# Patient Record
Sex: Male | Born: 1935 | Race: White | Hispanic: No | Marital: Married
Health system: Southern US, Community
[De-identification: ages and names within clinical notes are randomized; demographics above are authoritative.]

---

## 2003-09-26 ENCOUNTER — Inpatient Hospital Stay (HOSPITAL_COMMUNITY): Admission: RE | Admit: 2003-09-26 | Discharge: 2003-09-27 | Payer: Self-pay | Admitting: Cardiology

## 2004-12-23 ENCOUNTER — Emergency Department: Payer: Self-pay | Admitting: Emergency Medicine

## 2004-12-23 ENCOUNTER — Other Ambulatory Visit: Payer: Self-pay

## 2006-07-03 ENCOUNTER — Other Ambulatory Visit: Payer: Self-pay

## 2006-07-03 ENCOUNTER — Inpatient Hospital Stay: Payer: Self-pay | Admitting: Endocrinology

## 2006-07-05 ENCOUNTER — Other Ambulatory Visit: Payer: Self-pay

## 2006-07-06 ENCOUNTER — Other Ambulatory Visit: Payer: Self-pay

## 2006-07-07 ENCOUNTER — Other Ambulatory Visit: Payer: Self-pay

## 2006-07-09 ENCOUNTER — Other Ambulatory Visit: Payer: Self-pay

## 2007-07-25 ENCOUNTER — Ambulatory Visit: Payer: Self-pay | Admitting: Internal Medicine

## 2007-08-24 ENCOUNTER — Ambulatory Visit: Payer: Self-pay | Admitting: Gastroenterology

## 2007-09-20 ENCOUNTER — Ambulatory Visit: Payer: Self-pay | Admitting: Gastroenterology

## 2008-05-01 ENCOUNTER — Inpatient Hospital Stay: Payer: Self-pay | Admitting: Internal Medicine

## 2009-03-13 ENCOUNTER — Inpatient Hospital Stay: Payer: Self-pay | Admitting: Internal Medicine

## 2009-03-17 ENCOUNTER — Ambulatory Visit: Payer: Self-pay | Admitting: Internal Medicine

## 2009-05-01 ENCOUNTER — Ambulatory Visit: Payer: Self-pay | Admitting: Internal Medicine

## 2009-06-17 ENCOUNTER — Ambulatory Visit: Payer: Self-pay | Admitting: Gastroenterology

## 2010-09-15 ENCOUNTER — Ambulatory Visit: Payer: Self-pay | Admitting: Internal Medicine

## 2010-09-24 ENCOUNTER — Inpatient Hospital Stay: Payer: Self-pay | Admitting: Internal Medicine

## 2010-10-02 ENCOUNTER — Inpatient Hospital Stay: Payer: Self-pay | Admitting: Internal Medicine

## 2010-10-16 ENCOUNTER — Ambulatory Visit: Payer: Self-pay | Admitting: Internal Medicine

## 2011-01-06 ENCOUNTER — Ambulatory Visit: Payer: Self-pay | Admitting: Family Medicine

## 2011-01-07 ENCOUNTER — Ambulatory Visit: Payer: Self-pay | Admitting: Family Medicine

## 2011-02-28 ENCOUNTER — Ambulatory Visit: Payer: Self-pay | Admitting: Vascular Surgery

## 2011-02-28 LAB — BASIC METABOLIC PANEL
Anion Gap: 11 (ref 7–16)
BUN: 55 mg/dL — ABNORMAL HIGH (ref 7–18)
Calcium, Total: 8.5 mg/dL (ref 8.5–10.1)
Chloride: 112 mmol/L — ABNORMAL HIGH (ref 98–107)
Osmolality: 310 (ref 275–301)
Potassium: 3.5 mmol/L (ref 3.5–5.1)

## 2011-02-28 LAB — PROTIME-INR
INR: 1.1
Prothrombin Time: 14.7 secs (ref 11.5–14.7)

## 2011-02-28 LAB — CREATININE, SERUM
Creatinine: 1.9 mg/dL — ABNORMAL HIGH (ref 0.60–1.30)
EGFR (African American): 44 — ABNORMAL LOW
EGFR (Non-African Amer.): 36 — ABNORMAL LOW

## 2011-02-28 LAB — BUN: BUN: 57 mg/dL — ABNORMAL HIGH (ref 7–18)

## 2011-03-08 ENCOUNTER — Ambulatory Visit: Payer: Self-pay | Admitting: Family Medicine

## 2011-03-08 LAB — CBC WITH DIFFERENTIAL/PLATELET
Basophil #: 0 10*3/uL (ref 0.0–0.1)
Basophil %: 0.4 %
Eosinophil #: 0.1 10*3/uL (ref 0.0–0.7)
HCT: 23.2 % — ABNORMAL LOW (ref 40.0–52.0)
HGB: 7.6 g/dL — ABNORMAL LOW (ref 13.0–18.0)
Lymphocyte #: 1.2 10*3/uL (ref 1.0–3.6)
Lymphocyte %: 10.4 %
MCH: 30.3 pg (ref 26.0–34.0)
MCHC: 32.9 g/dL (ref 32.0–36.0)
MCV: 92 fL (ref 80–100)
Monocyte #: 0.8 10*3/uL — ABNORMAL HIGH (ref 0.0–0.7)
Neutrophil #: 9.1 10*3/uL — ABNORMAL HIGH (ref 1.4–6.5)
Platelet: 257 10*3/uL (ref 150–440)
RDW: 18.7 % — ABNORMAL HIGH (ref 11.5–14.5)
WBC: 11.2 10*3/uL — ABNORMAL HIGH (ref 3.8–10.6)

## 2011-04-15 DEATH — deceased

## 2012-12-06 IMAGING — CR DG CHEST 2V
1 series · 2 of 2 positions shown · non-contrast
Comparison: none

REASON FOR EXAM: weakness
COMMENTS:

[Series 1: view not recorded · 0.17mm/px · 2 of 2 slices shown]
[im 1/2]
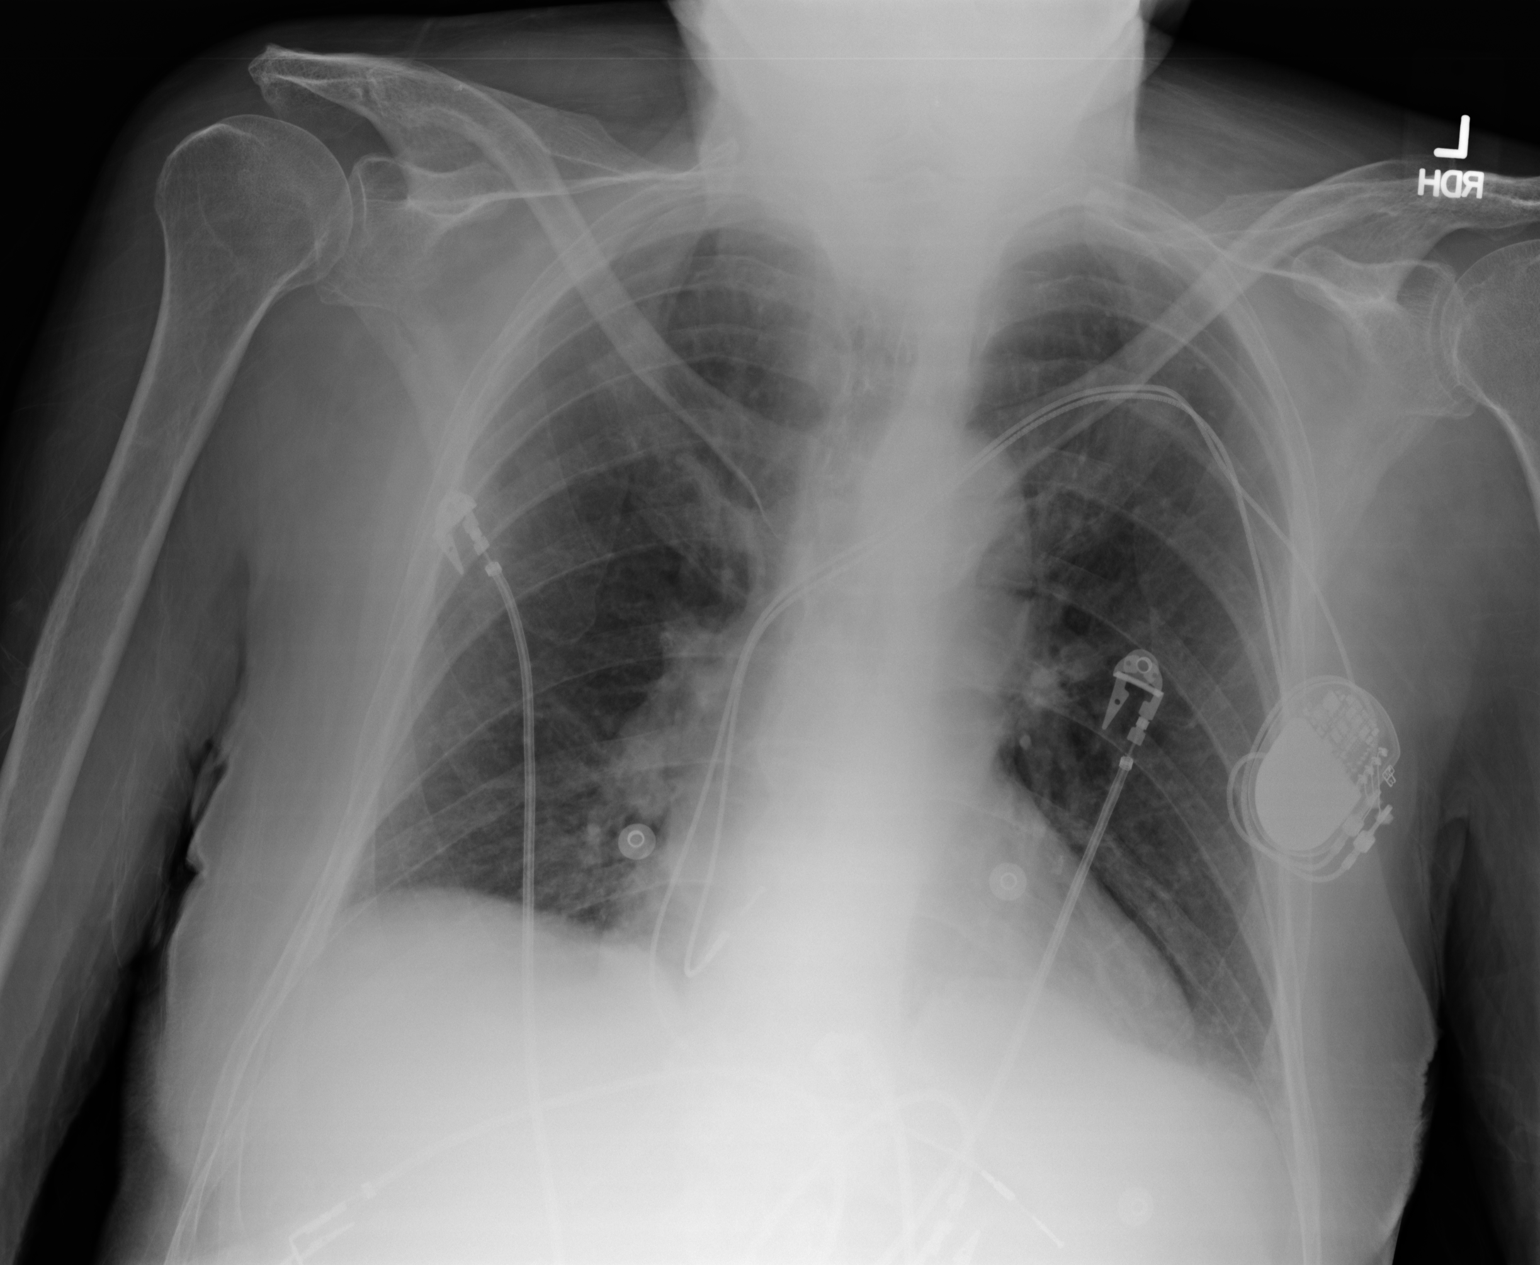
[im 2/2]
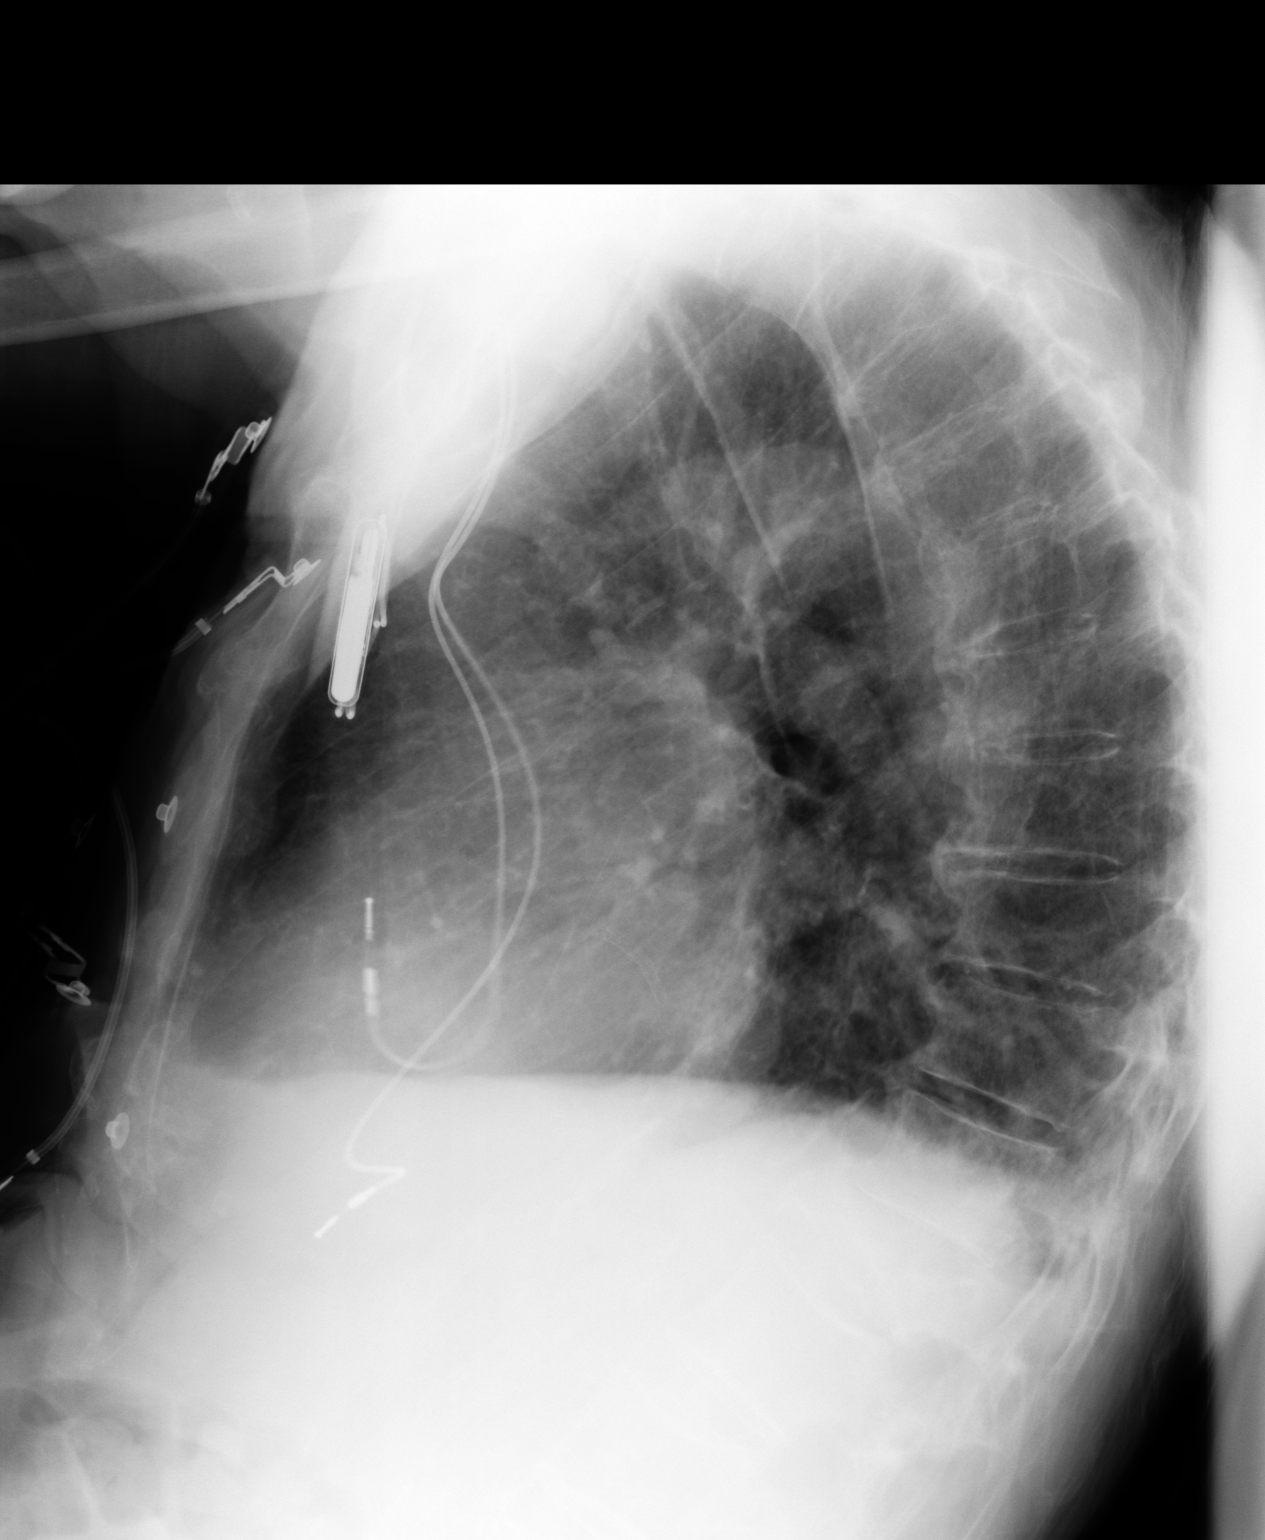

[2 of 2 positions shown; findings below may reference images not displayed]

PROCEDURE:     DXR - DXR CHEST PA (OR AP) AND LATERAL  - October 02, 2010  [DATE]

RESULT:     The lungs are clear. The cardiovascular structures are
unremarkable.  Cardiac pacer noted with lead tips in the right atrium and
right ventricle. Degenerative change is noted of the thoracic spine. Chest
is stable from 09/24/2010.
IMPRESSION: See above.

## 2013-05-04 IMAGING — XA IR VASCULAR PROCEDURE
15 of 23 series · 15 of 24 positions shown · non-contrast
Comparison: none

[Series 1: care aorta · 1 of 2 slices shown]
[im 1/2]
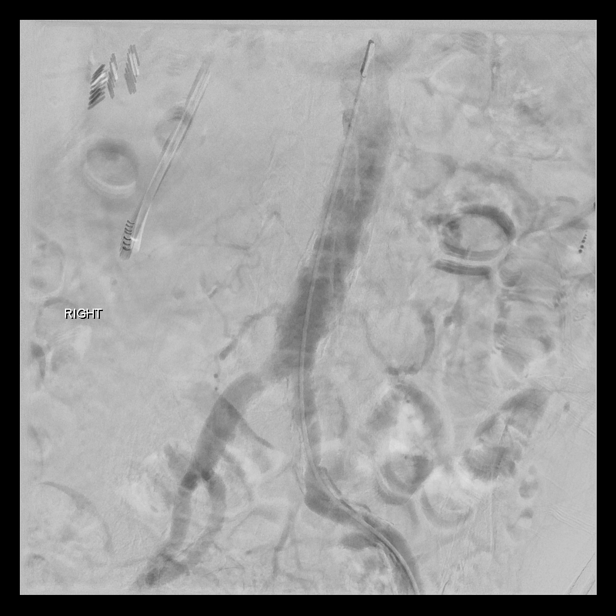

[Series 3: care sfa · 1 of 2 slices shown (1 of 12)]
[im 1/2]
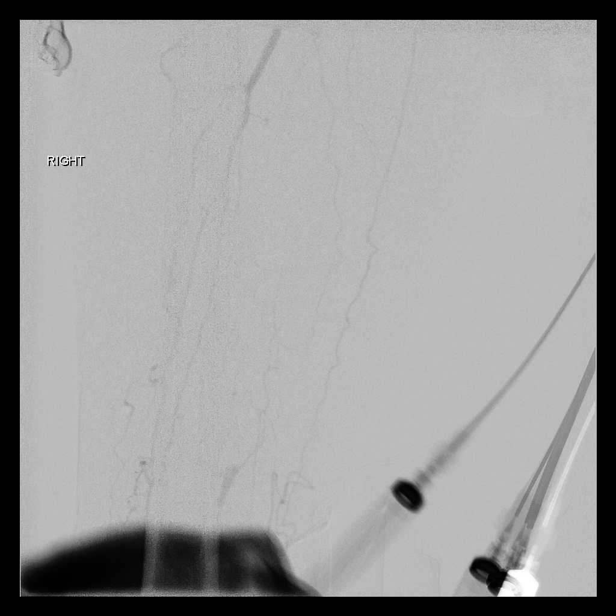

[Series 6: care sfa · 1 of 2 slices shown (2 of 12)]
[im 1/2]
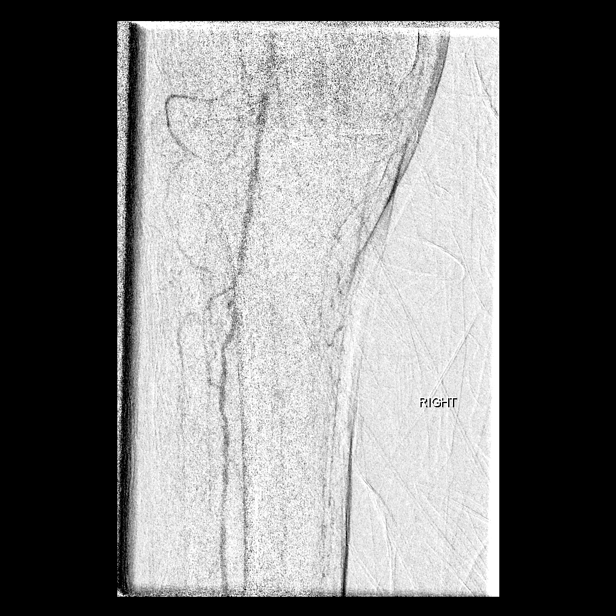

[Series 7: care sfa · 1 of 2 slices shown (3 of 12)]
[im 1/2]
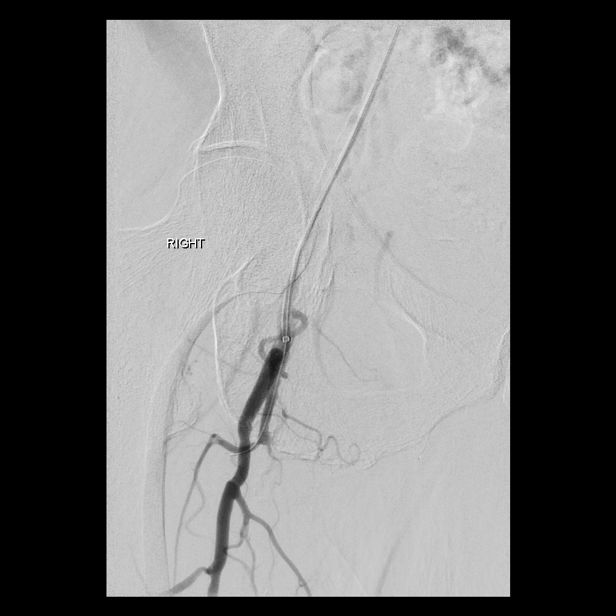

[Series 9: care sfa · 1 of 2 slices shown (4 of 12)]
[im 1/2]
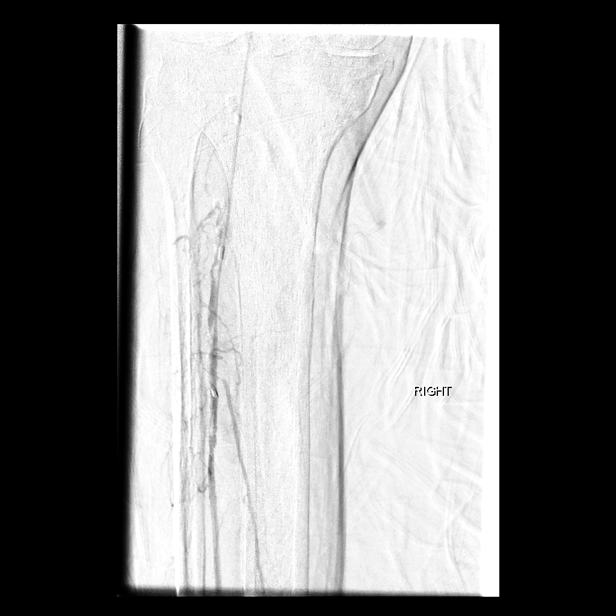

[Series 10: care sfa · 1 of 2 slices shown (5 of 12)]
[im 1/2]
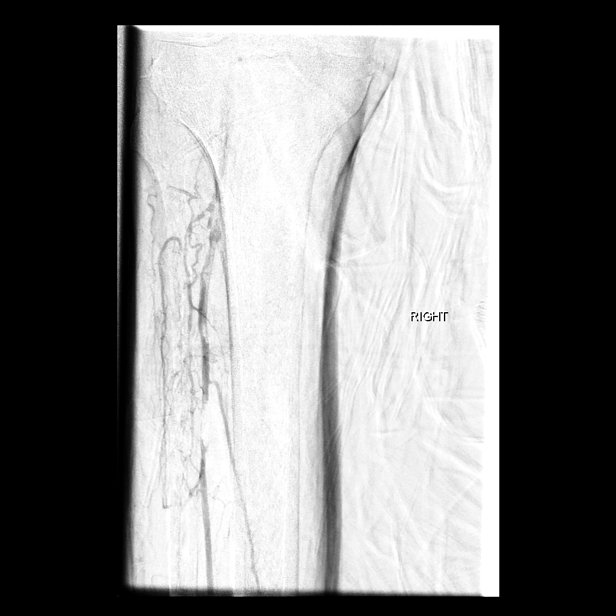

[Series 12: fl - angio · 1 of 1 slices shown (1 of 2)]
[im 1/1]
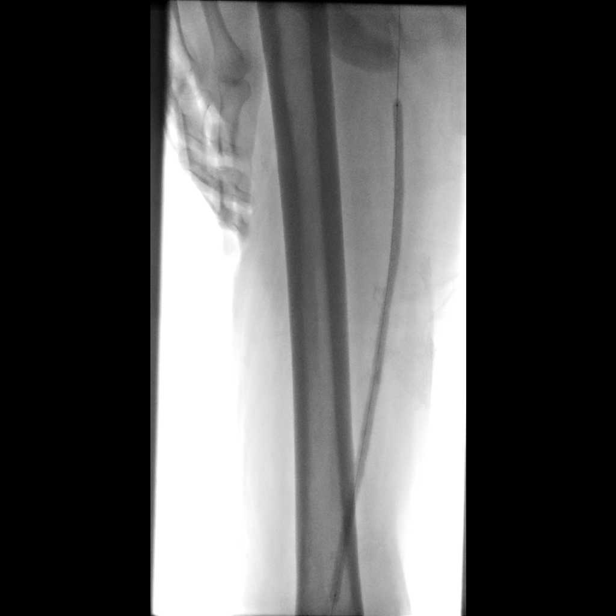

[Series 14: care sfa · 1 of 2 slices shown (6 of 12)]
[im 1/2]
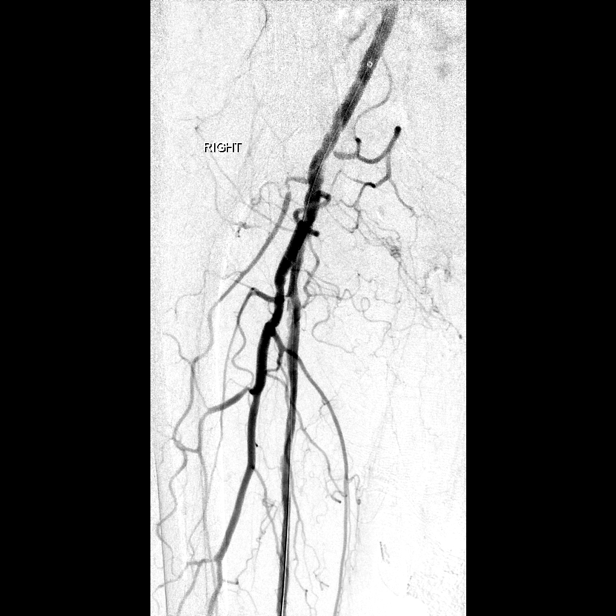

[Series 16: care sfa · 1 of 2 slices shown (7 of 12)]
[im 1/2]
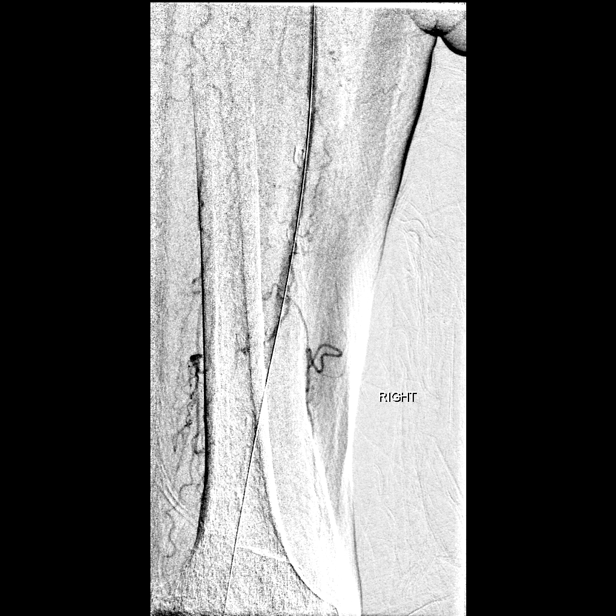

[Series 18: care sfa · 1 of 2 slices shown (8 of 12)]
[im 1/2]
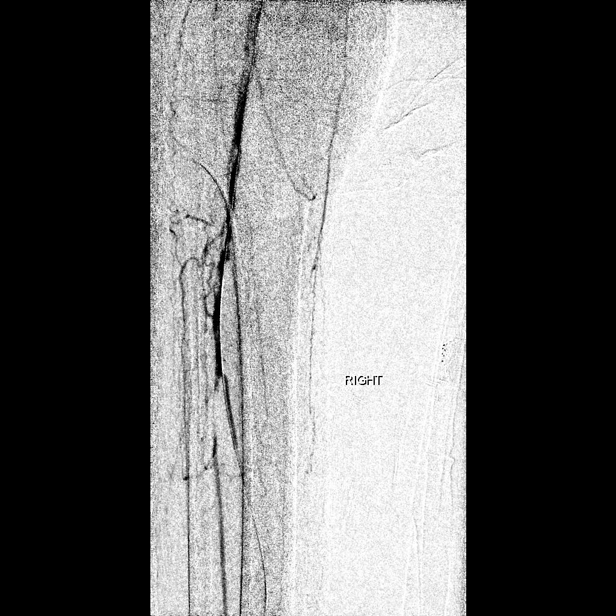

[Series 19: care sfa · 1 of 4 slices shown (9 of 12)]
[im 1/4]
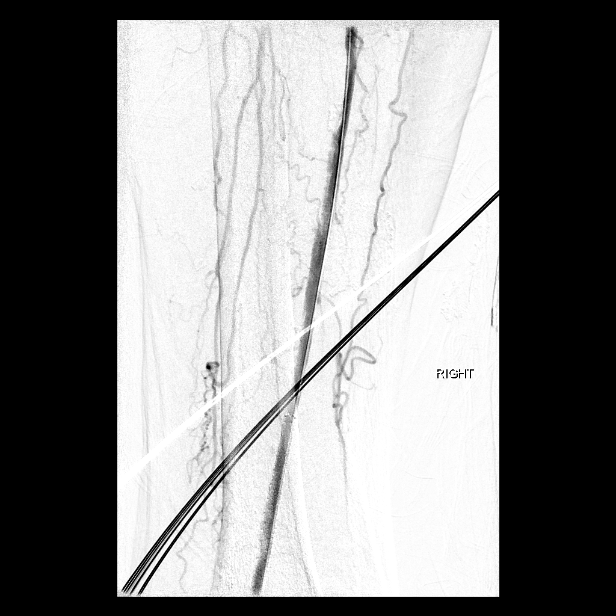

[Series 20: fl - angio · 1 of 1 slices shown (2 of 2)]
[im 1/1]
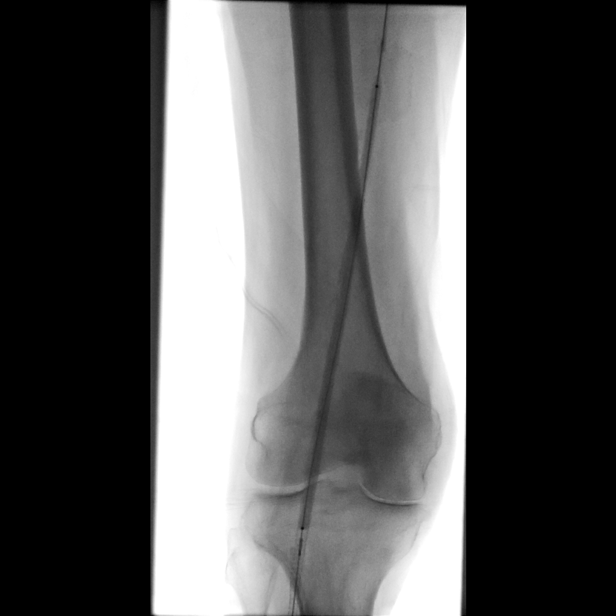

[Series 22: care sfa · 1 of 2 slices shown (10 of 12)]
[im 1/2]
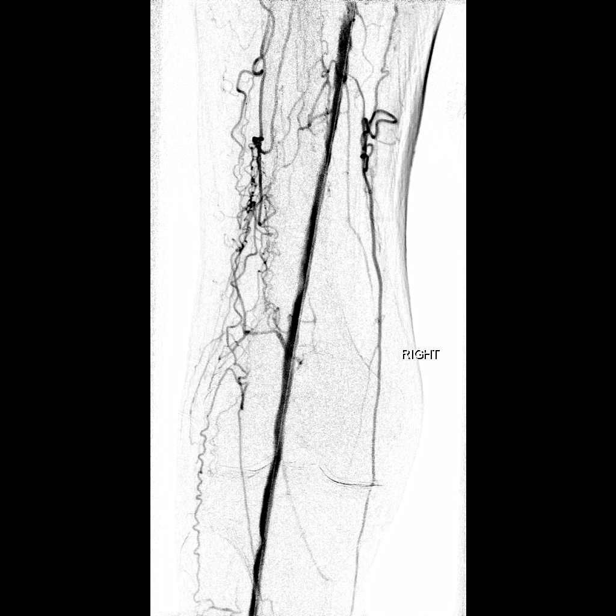

[Series 23: care sfa · 1 of 2 slices shown (11 of 12)]
[im 1/2]
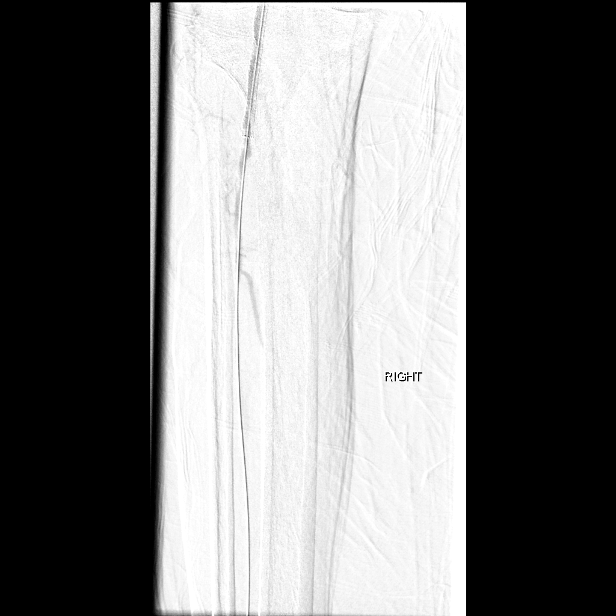

[Series 25: care sfa · 1 of 1 slices shown (12 of 12)]
[im 1/1]
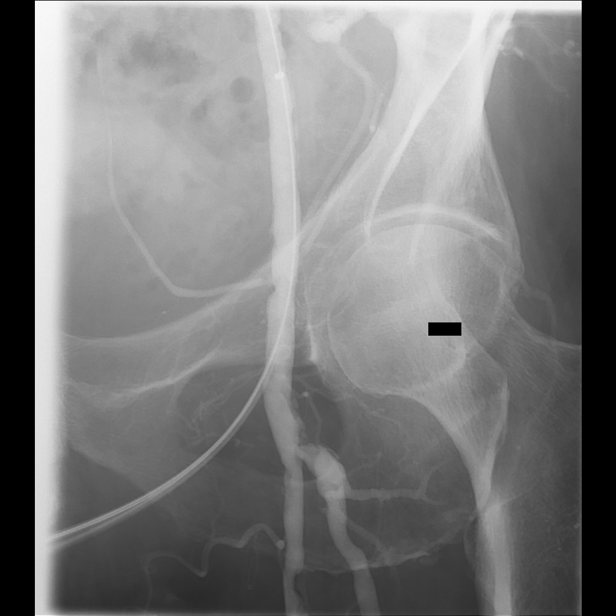

[15 of 24 positions shown; findings below may reference images not displayed]

IMAGES IMPORTED FROM THE [employer] WORKFLOW SYSTEM
NO DICTATION FOR STUDY

## 2014-06-08 NOTE — Op Note (Signed)
PATIENT NAME:  Cameron Golden, FREEMAN W MR#:  [id number] DATE OF BIRTH:  [birth year]  DATE OF PROCEDURE:  02/28/2011  PREOPERATIVE DIAGNOSES:  1. Peripheral arterial disease with multiple nonhealing ulcerations right lower extremity.  2. Advanced dementia.  3. Four nonhealing ulcers right lower extremity.   POSTOPERATIVE DIAGNOSES:  1. Peripheral arterial disease with multiple nonhealing ulcerations right lower extremity.  2. Advanced dementia.  3. Four nonhealing ulcers right lower extremity.   PROCEDURES: 1. Catheter placement into right posterior tibial artery from left femoral approach.  2. Aortogram and selective right lower extremity angiogram.  3. Percutaneous transluminal angioplasty of right posterior tibial artery and tibioperoneal trunk with 3 mm diameter angioplasty balloon.  4. Percutaneous transluminal angioplasty of entire popliteal artery and superficial femoral artery with 5 mm diameter angioplasty balloon.  5. Self-expanding stent placement to popliteal artery and distal superficial femoral artery for greater than 50% stenosis and dissection after angioplasty.  6. StarClose closure device left femoral artery.   SURGEON: Annice NeedyJason S. Armon Orvis, M.D.   ANESTHESIA: Local with moderate conscious sedation.   ESTIMATED BLOOD LOSS: Minimal.   FLUOROSCOPY TIME: 8.9.  CONTRAST USED: 80 mL.   INDICATION FOR PROCEDURE: This is a demented 81 year old individual who has four nonhealing ulcers of the right lower extremity that have been present for some time. These are large with a necrotic eschar. He has no palpable pedal pulses and is clearly ischemic. He is brought down for angiography with possible revascularization for attempt at limb salvage. Risks and benefits were discussed. Informed consent was obtained.   DESCRIPTION OF PROCEDURE: Patient is brought to the vascular interventional radiology suite, groins shaved and prepped, sterile surgical field was created. The left femoral head was  localized wit fluoroscopy and the left femoral artery was accessed without difficulty with a Seldinger needle. A 3-J wire was placed, a 5 French sheath was placed over the wire. Pigtail catheter was placed in the aortic at the L1 level and AP aortogram was performed. This showed no significant flow limiting stenosis in the aortoiliac segments. There was what appeared to be patent renal arteries. I then hooked the aortic bifurcation, advanced to the right femoral head and selective right lower extremity angiogram was then performed. This showed a flush occlusion of the right SFA, a very diseased popliteal reconstituted, distal reconstitution was hard to see. I then put a Terumo Advantage wire and a 6 [place] Ansel sheath and somehow was able to cannulate the superficial femoral artery with surprisingly little difficulty despite the nature of the occlusion. Crossing the occlusion was also reasonably straightforward and I was able to advance a CXI catheter into the popliteal artery and then later into the posterior tibial artery and confirm intraluminal flow. At this point I placed a 0.018 wire, performed percutaneous transluminal angioplasty with a 3 mm diameter angioplasty balloon in the posterior tibial artery and the tibioperoneal trunk for significant diffuse disease in the proximal segments of the posterior tibial artery and the TP trunk. The popliteal and superficial femoral artery in their entirety was then treated with a 5 mm diameter angioplasty balloon. Following this, the proximal superficial femoral artery was mildly irregular but was not significantly stenotic. The distal superficial femoral artery was essentially occluded at likely the re-entry point into the above-knee popliteal artery and there were significant disease that tracked down into the below-knee popliteal artery. I exchanged for an 0.035 wire and treated this with a 6 mm diameter self-expanding stent ironed out with 5 mm balloon. Completion  angiogram now showed these areas to be patent with markedly improved flow distally and at this point I elected to terminate the procedure. The oblique arteriogram was performed left femoral artery and StarClose closure device deployed in the usual fashion with excellent hemostatic result. The patient tolerated the procedure well and was taken to the recovery room in stable condition.    ____________________________ Annice Needy, MD jsd:cms D: 02/28/2011 13:27:28 ET T: 03/01/2011 09:20:34 ET JOB#: [id number] cc: Annice Needy, MD, <Dictator> Teena Irani. Terance Hart, MD Annice Needy MD ELECTRONICALLY SIGNED 18-Apr-2011 11:08
